# Patient Record
Sex: Male | Born: 1980 | Hispanic: Yes | Marital: Single | Smoking: Never smoker
Health system: Southern US, Community
[De-identification: ages and names within clinical notes are randomized; demographics above are authoritative.]

---

## 2017-08-12 ENCOUNTER — Emergency Department: Payer: Self-pay

## 2017-08-12 ENCOUNTER — Other Ambulatory Visit: Payer: Self-pay

## 2017-08-12 ENCOUNTER — Emergency Department
Admission: EM | Admit: 2017-08-12 | Discharge: 2017-08-12 | Payer: Self-pay | Attending: Emergency Medicine | Admitting: Emergency Medicine

## 2017-08-12 DIAGNOSIS — W19XXXA Unspecified fall, initial encounter: Secondary | ICD-10-CM | POA: Insufficient documentation

## 2017-08-12 DIAGNOSIS — Y998 Other external cause status: Secondary | ICD-10-CM | POA: Insufficient documentation

## 2017-08-12 DIAGNOSIS — S0181XA Laceration without foreign body of other part of head, initial encounter: Secondary | ICD-10-CM | POA: Insufficient documentation

## 2017-08-12 DIAGNOSIS — R42 Dizziness and giddiness: Secondary | ICD-10-CM | POA: Insufficient documentation

## 2017-08-12 DIAGNOSIS — S40012A Contusion of left shoulder, initial encounter: Secondary | ICD-10-CM | POA: Insufficient documentation

## 2017-08-12 DIAGNOSIS — Y929 Unspecified place or not applicable: Secondary | ICD-10-CM | POA: Insufficient documentation

## 2017-08-12 DIAGNOSIS — Y9389 Activity, other specified: Secondary | ICD-10-CM | POA: Insufficient documentation
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
## 2017-08-12 NOTE — ED Notes (Signed)
Pt discharged to home.  Pt [facility]/c back to jail in [facility] custody.  Discharge instructions reviewed.  Verbalized understanding.  No questions or concerns at this time.  Teach back verified.  Pt in NAD.  No items left in ED.

## 2019-05-20 IMAGING — CR DG SHOULDER 2+V*L*
1 series · 3 of 3 positions shown · non-contrast
Comparison: None.

CLINICAL DATA: Fall today on LEFT shoulder, pain and swelling.

EXAM:
LEFT SHOULDER - 2+ VIEW

[Series 1: dg shoulder left · 0.14mm/px · 3 of 3 slices shown]
[im 1/3]
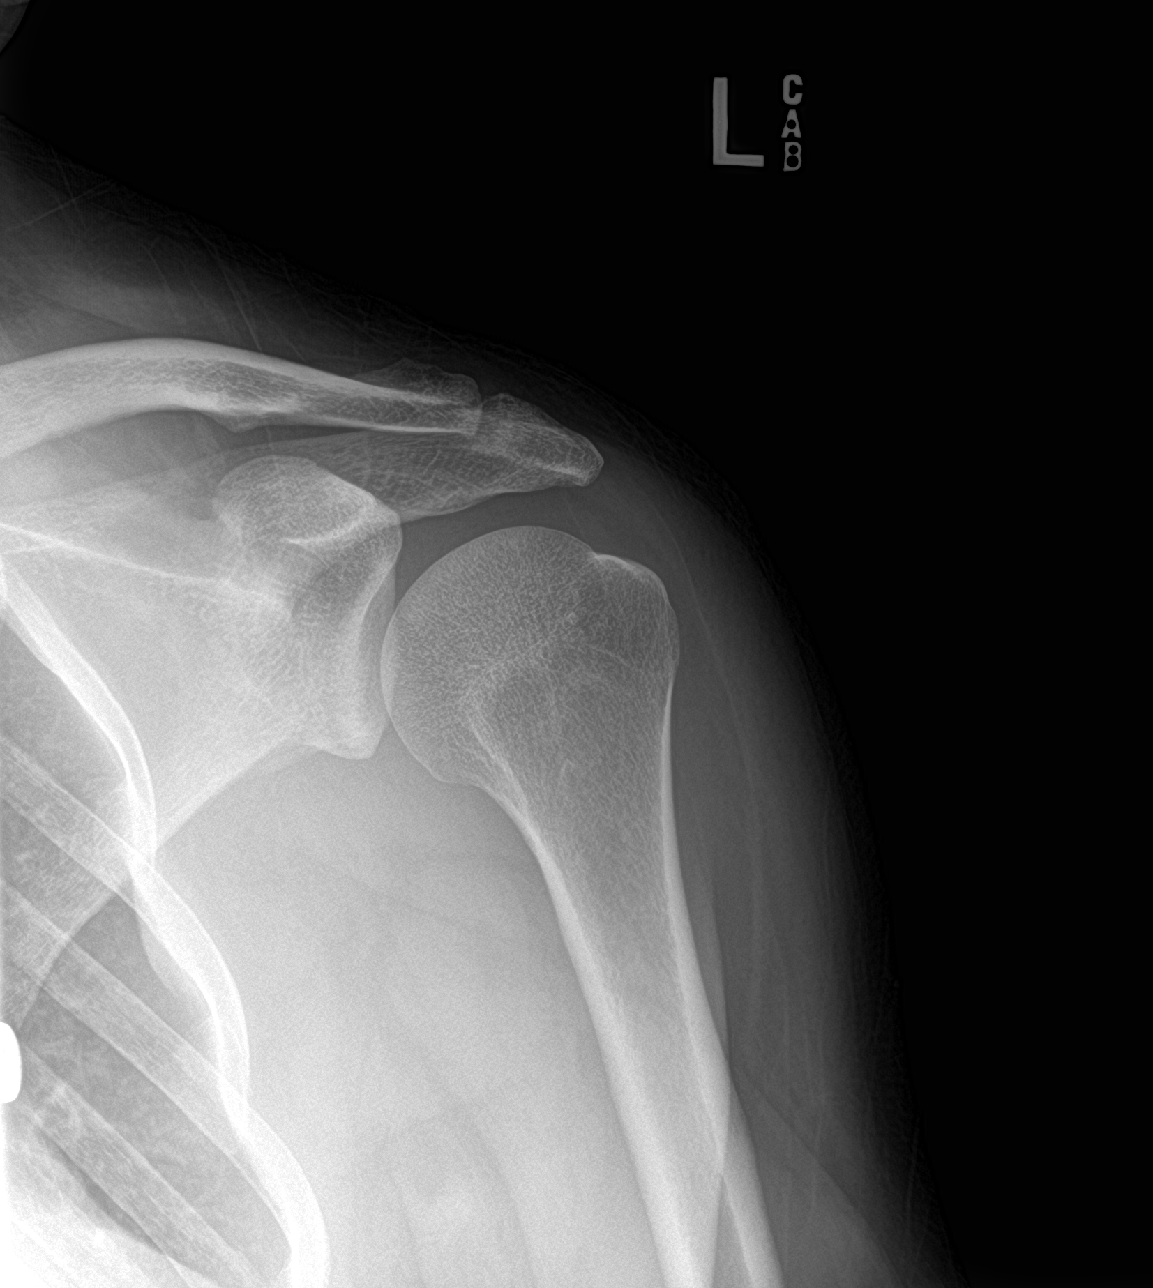
[im 2/3]
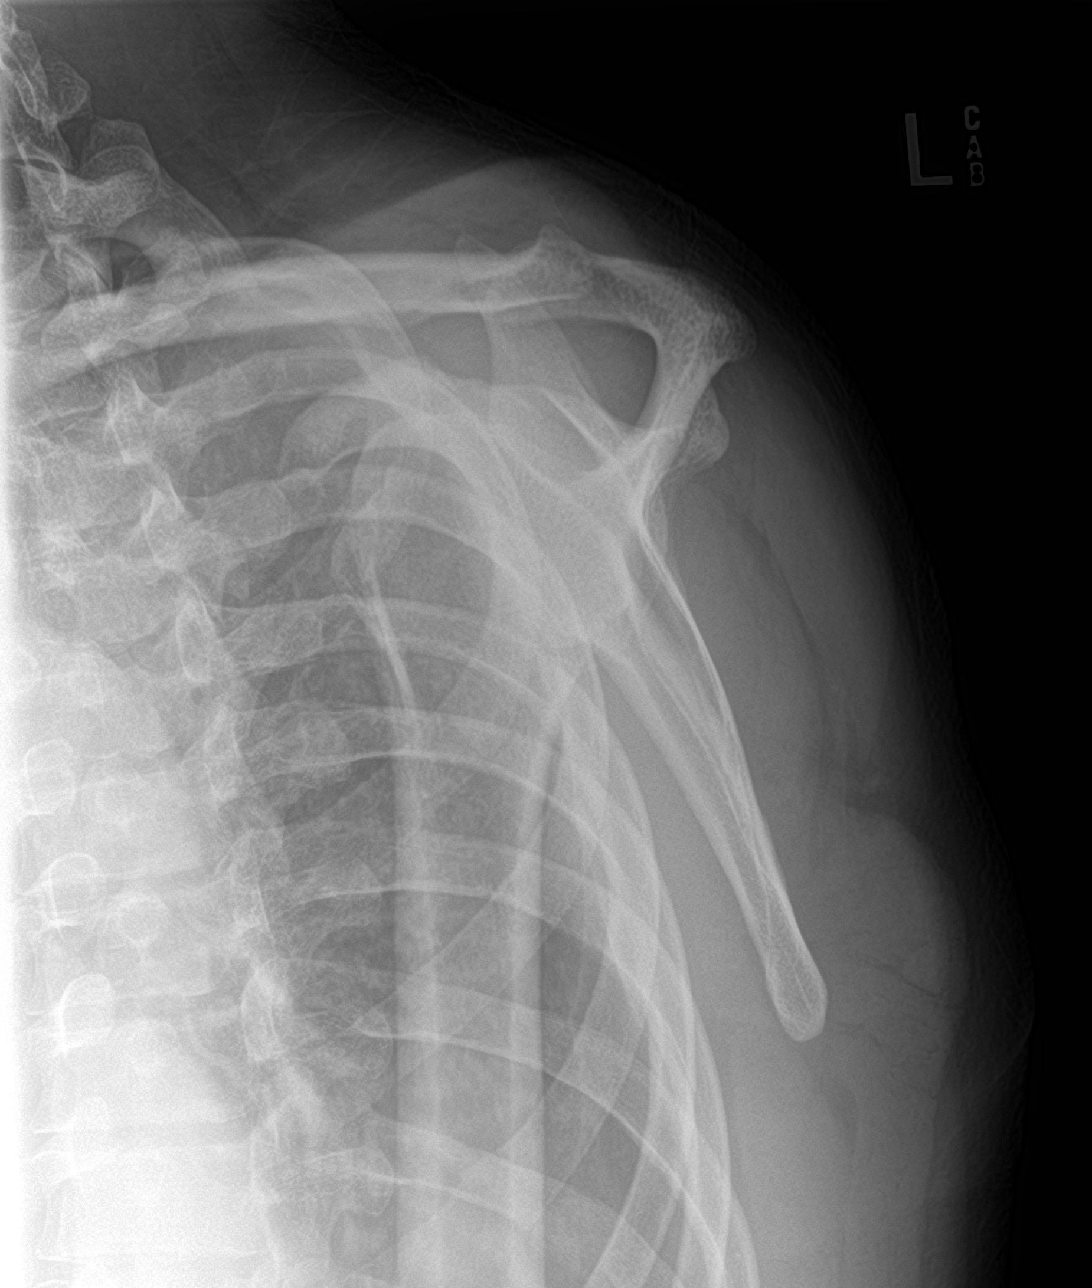
[im 3/3]
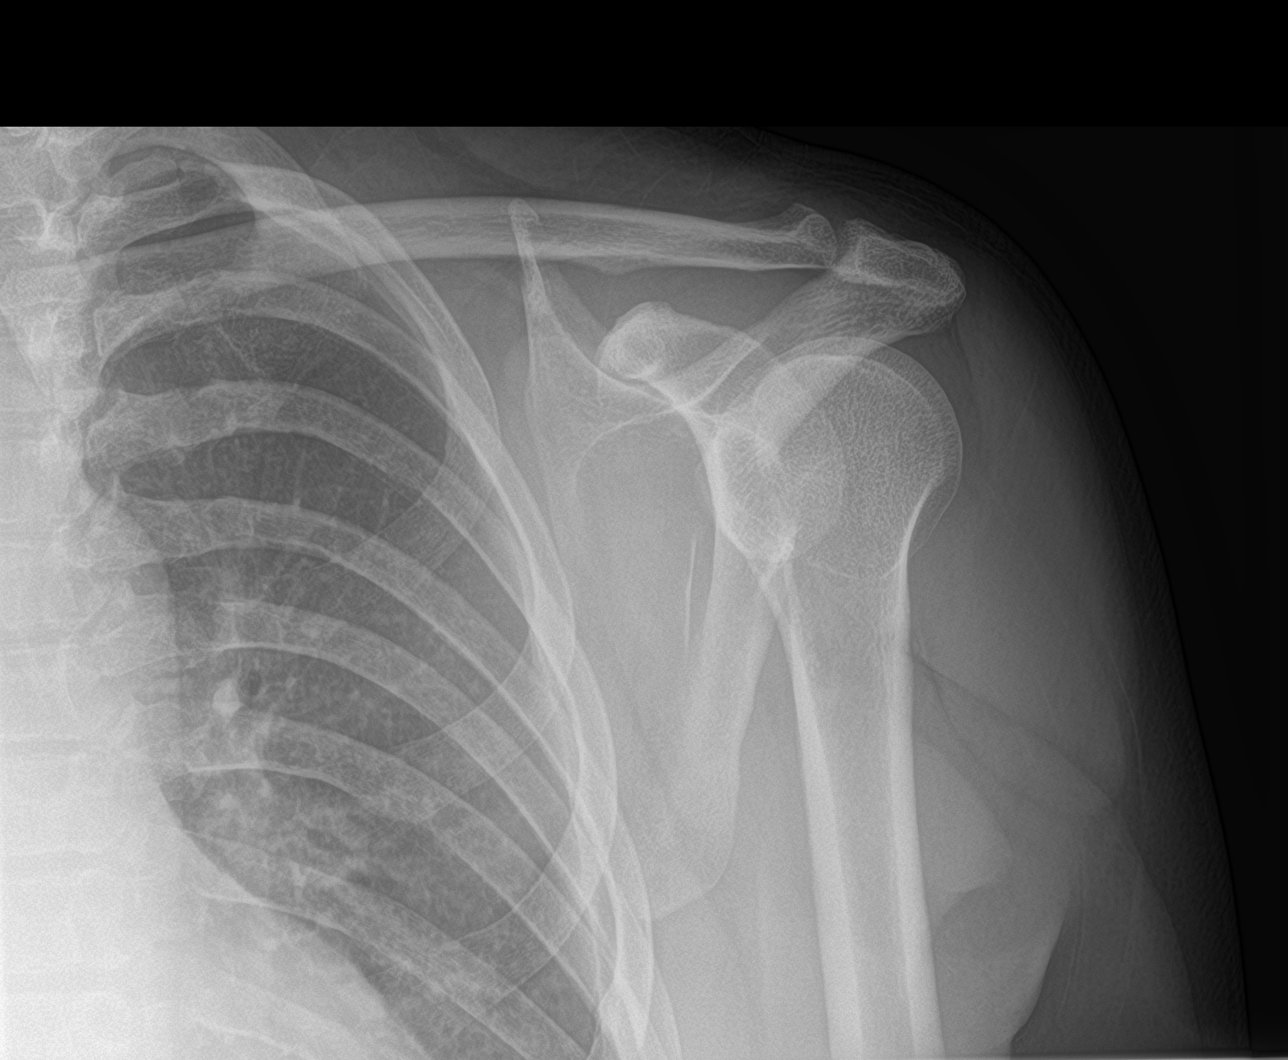

[3 of 3 positions shown; findings below may reference images not displayed]

FINDINGS: There is no evidence of fracture or dislocation. There is no
evidence of arthropathy or other focal bone abnormality. Soft
tissues are unremarkable.
IMPRESSION: Negative.
